# Patient Record
Sex: Female | Born: 2005 | Race: Black or African American | Hispanic: No | Marital: Single | Smoking: Never smoker
Health system: Southern US, Community
[De-identification: ages and names within clinical notes are randomized; demographics above are authoritative.]

## PROBLEM LIST (undated history)

## (undated) DIAGNOSIS — T7840XA Allergy, unspecified, initial encounter: Secondary | ICD-10-CM

## (undated) DIAGNOSIS — J40 Bronchitis, not specified as acute or chronic: Secondary | ICD-10-CM

## (undated) HISTORY — DX: Allergy, unspecified, initial encounter: T78.40XA

---
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
## 2024-01-16 ENCOUNTER — Telehealth: Payer: Self-pay

## 2024-01-16 NOTE — Telephone Encounter (Signed)
 Copied from CRM [id number]. Topic: Referral - Request for Referral >> Jan 16, 2024  8:41 AM Sim Boast F wrote: Did the patient discuss referral with their provider in the last year? Yes (If No - schedule appointment) (If Yes - send message)  Appointment offered? No  Type of order/referral and detailed reason for visit: 2 lumps on the side of neck for about a week ( not painful )  Preference of office, provider, location: No preference, wants to know where she should be referred too   If referral order, have you been seen by this specialty before? No (If Yes, this issue or another issue? When? Where?  Can we respond through MyChart? No

## 2024-01-16 NOTE — Telephone Encounter (Signed)
 Called Pt to scheduled OV; left VM for call back.

## 2024-04-24 ENCOUNTER — Encounter: Admitting: Internal Medicine

## 2024-06-12 ENCOUNTER — Other Ambulatory Visit: Payer: Self-pay

## 2024-06-12 ENCOUNTER — Emergency Department

## 2024-06-12 ENCOUNTER — Emergency Department
Admission: EM | Admit: 2024-06-12 | Discharge: 2024-06-12 | Disposition: A | Discharge status: Home / Self Care | Attending: Emergency Medicine | Admitting: Emergency Medicine

## 2024-06-12 DIAGNOSIS — W01198A Fall on same level from slipping, tripping and stumbling with subsequent striking against other object, initial encounter: Secondary | ICD-10-CM | POA: Insufficient documentation

## 2024-06-12 DIAGNOSIS — S6992XA Unspecified injury of left wrist, hand and finger(s), initial encounter: Secondary | ICD-10-CM | POA: Diagnosis present

## 2024-06-12 DIAGNOSIS — Y9351 Activity, roller skating (inline) and skateboarding: Secondary | ICD-10-CM | POA: Diagnosis not present

## 2024-06-12 DIAGNOSIS — S0990XA Unspecified injury of head, initial encounter: Secondary | ICD-10-CM | POA: Diagnosis not present

## 2024-06-12 DIAGNOSIS — S63502A Unspecified sprain of left wrist, initial encounter: Secondary | ICD-10-CM | POA: Diagnosis not present

## 2024-06-12 NOTE — ED Triage Notes (Signed)
 Pt reports she was roller skating earlier tonight, fell backwards and hit the back of her head on the ground. Pt denies headache or neck pain, no loc. Pt also reports left wrist pain from fall.

## 2024-06-12 NOTE — Discharge Instructions (Signed)
 The x-ray of your left wrist was negative for fractures.  I believe you have a wrist sprain.  It is normal for symptoms to slowly develop over time with a head injury.  Symptoms of a concussion would include headache, nausea, vomiting, vision changes, dizziness, confusion, light sensitivity, sound sensitivity, difficulty sleeping and mood swings.  If the symptoms persist for longer than 2 weeks please be reevaluated.  Return to the emergency department with any worsening symptoms.  You can take 650 mg of Tylenol and 600 mg of ibuprofen every 6 hours as needed for pain. You can use ice, heat, muscle creams and other topical pain relievers as well.

## 2024-06-12 NOTE — ED Provider Notes (Signed)
 [facility] Provider Note    Event Date/Time   First MD Initiated Contact with Patient 06/12/24 2108     (approximate)   History   Head Injury   HPI  Michele Dickerson is a 18 y.o. female with no PMH presents for evaluation after a fall on her skin today.  Patient states that she fell backwards and hit the back of her head on the ground and caught herself with her left wrist.  She reports pain to her left wrist and a very mild headache.  She denies neck pain, nausea, dizziness, visual changes, light sensitivity.      Physical Exam   Triage Vital Signs: ED Triage Vitals  Encounter Vitals Group     BP 06/12/24 2001 (!) 150/85     Girls Systolic BP Percentile --      Girls Diastolic BP Percentile --      Boys Systolic BP Percentile --      Boys Diastolic BP Percentile --      Pulse Rate 06/12/24 2001 67     Resp 06/12/24 2001 16     Temp 06/12/24 2001 98.3 F (36.8 C)     Temp Source 06/12/24 2001 Oral     SpO2 06/12/24 2001 100 %     Weight 06/12/24 2000 165 lb (74.8 kg)     Height 06/12/24 2000 5' 11 (1.803 m)     Head Circumference --      Peak Flow --      Pain Score 06/12/24 2000 3     Pain Loc --      Pain Education --      Exclude from Growth Chart --     Most recent vital signs: Vitals:   06/12/24 2001  BP: (!) 150/85  Pulse: 67  Resp: 16  Temp: 98.3 F (36.8 C)  SpO2: 100%   General: Awake, no distress.  CV:  Good peripheral perfusion.  RRR. Resp:  Normal effort.  CTAB. Abd:  No distention.  Left wrist: No significant swelling when compared to the right side, very mild tenderness to palpation, wrist range of motion well-maintained, radial pulse 2+ and regular, no tenderness in the hand, grip strength is equal bilaterally. Other:  PERRL, EOM intact, no ataxia, no pronator drift, no focal neurodeficits.   ED Results / Procedures / Treatments   Labs (all labs ordered are listed, but only abnormal results are  displayed) Labs Reviewed - No data to display  RADIOLOGY  Left wrist x-ray obtained, interpreted the images as well as reviewed the radiologist report which was negative for fractures.  PROCEDURES:  Critical Care performed: No  Procedures   MEDICATIONS ORDERED IN ED: Medications - No data to display   IMPRESSION / MDM / ASSESSMENT AND PLAN / ED COURSE  I reviewed the triage vital signs and the nursing notes.                             18 year old female presents for evaluation after a fall today.  Blood pressure was elevated otherwise vital signs are stable.  Patient NAD on exam.  Differential diagnosis includes, but is not limited to, wrist sprain, wrist fracture, wrist dislocation, closed head injury, concussion.  Patient's presentation is most consistent with acute complicated illness / injury requiring diagnostic workup.  Physical exam is overall very reassuring.  There were no neurodeficits identified.  Do not feel that imaging is  indicated.  Patient has very minimal symptoms at this time decreasing my suspicion for a concussion.  I did review signs and symptoms of a concussion and when to return to the emergency department.    X-ray of the left wrist is negative for fractures.  Patient was mildly tender on exam but wrist range of motion is well-maintained.  I did offer a wrist brace but patient declined.  Given minimal pain I felt this is reasonable.  She can have Tylenol and ibuprofen as needed for pain.  She does not need a note for work.  We reviewed return precautions.  She voiced understanding, all questions were answered and she was stable at discharge.      FINAL CLINICAL IMPRESSION(S) / ED DIAGNOSES   Final diagnoses:  Injury of head, initial encounter  Sprain of left wrist, initial encounter     Rx / DC Orders   ED Discharge Orders     None        Note:  This document was prepared using Dragon voice recognition software and may include  unintentional dictation errors.   Cleaster Tinnie LABOR, PA-C 06/12/24 2136    Viviann Pastor, MD 06/12/24 [phone]
